# Patient Record
Sex: Male | Born: 1960 | Race: White | Hispanic: No | Marital: Married | Smoking: Current every day smoker
Health system: Southern US, Community
[De-identification: ages and names within clinical notes are randomized; demographics above are authoritative.]

## PROBLEM LIST (undated history)

## (undated) DIAGNOSIS — R0600 Dyspnea, unspecified: Secondary | ICD-10-CM

## (undated) DIAGNOSIS — M1990 Unspecified osteoarthritis, unspecified site: Secondary | ICD-10-CM

## (undated) HISTORY — PX: APPENDECTOMY: SHX54

---

## 2006-07-20 ENCOUNTER — Inpatient Hospital Stay: Payer: Self-pay | Admitting: Surgery

## 2006-07-20 ENCOUNTER — Other Ambulatory Visit: Payer: Self-pay

## 2010-10-09 ENCOUNTER — Emergency Department: Payer: Self-pay | Admitting: Unknown Physician Specialty

## 2011-11-29 IMAGING — CT CT MAXILLOFACIAL WITH CONTRAST
1 series · 15 of 30 positions shown, 19 images · non-contrast
Comparison: No comparison

REASON FOR EXAM: pain and swelling/ redness right eye and  upper cheek
area
COMMENTS:

PROCEDURE:     CT  - CT MAXILLOFACIAL AREA W  - October 09, 2010  [DATE]
RESULT:     History: Redness and swelling
TECHNIQUE: Multiple axial images obtained of the maxillofacial bones with
coronal reformatted images provided a following 75 mL of Tsovue-SFA
intravenous contrast.

[Series 3: soft tissue · axial · 0.37mm/px · z∈[+732,+936]mm · 15 of 74 slices shown, 19 images]
[im 3/74  brain]
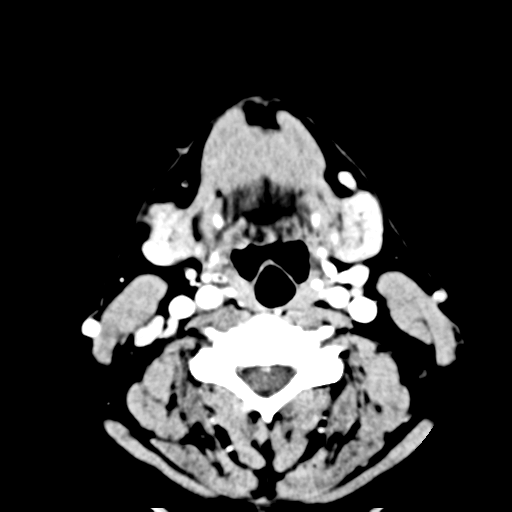
[im 3/74  bone]
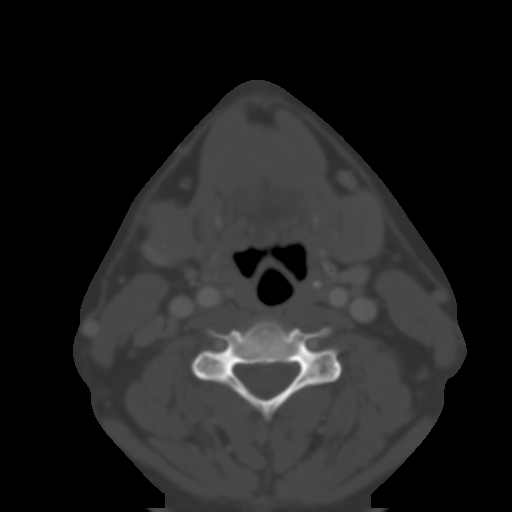
[im 8/74  bone]
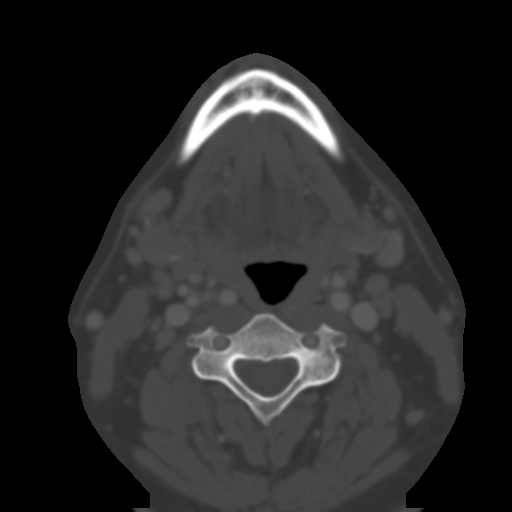
[im 13/74  bone]
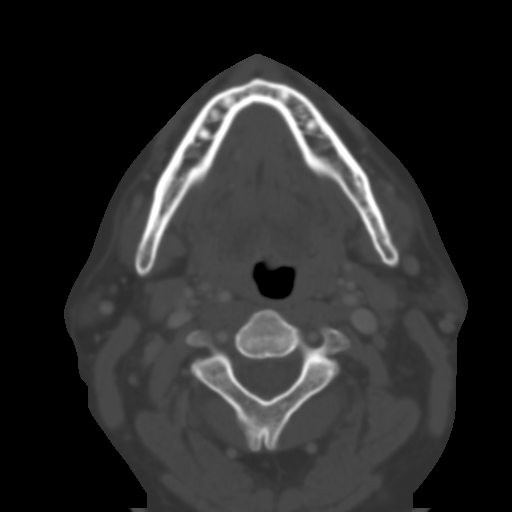
[im 18/74  bone]
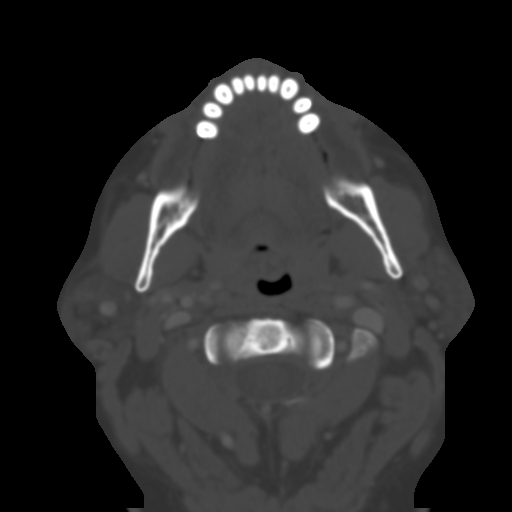
[im 23/74  brain]
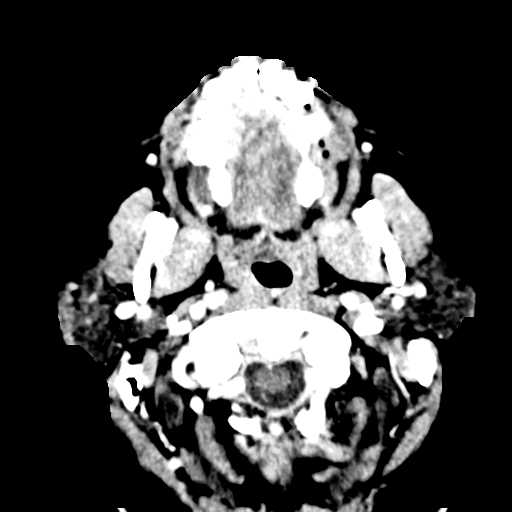
[im 23/74  bone]
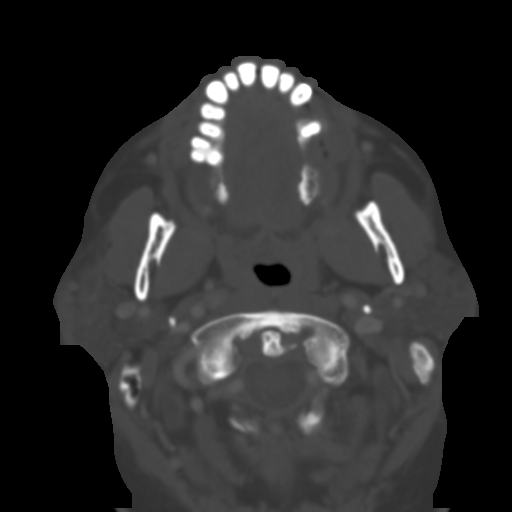
[im 28/74  bone]
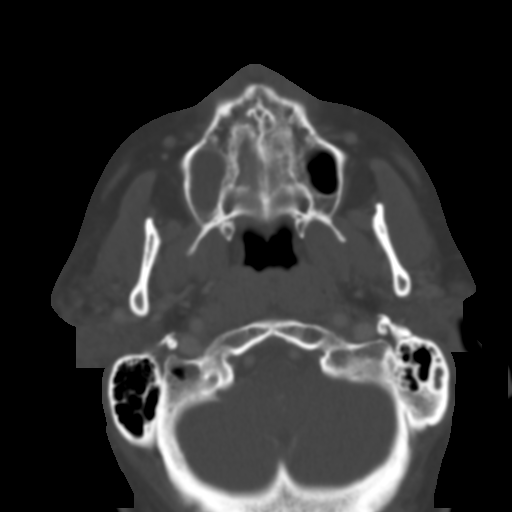
[im 33/74  bone]
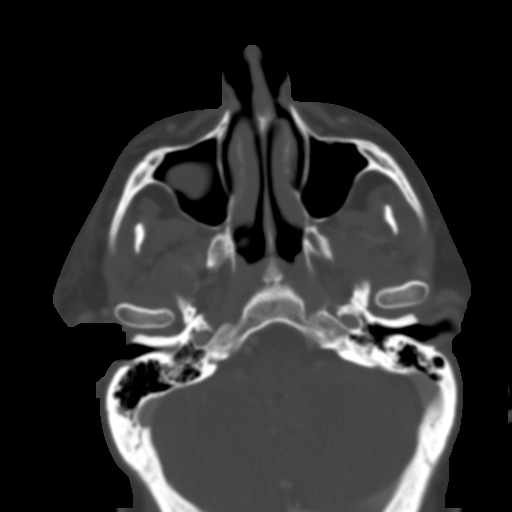
[im 38/74  bone]
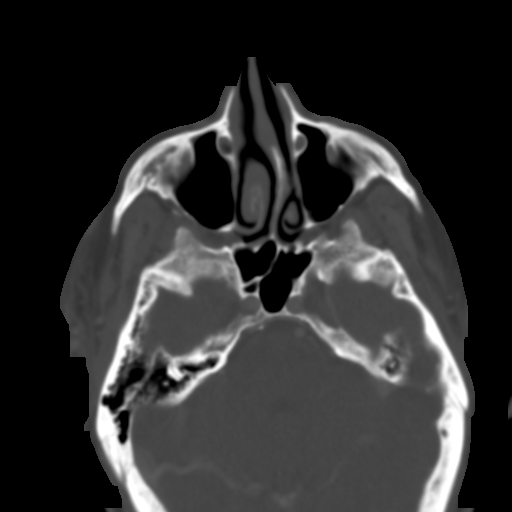
[im 41/74  brain]
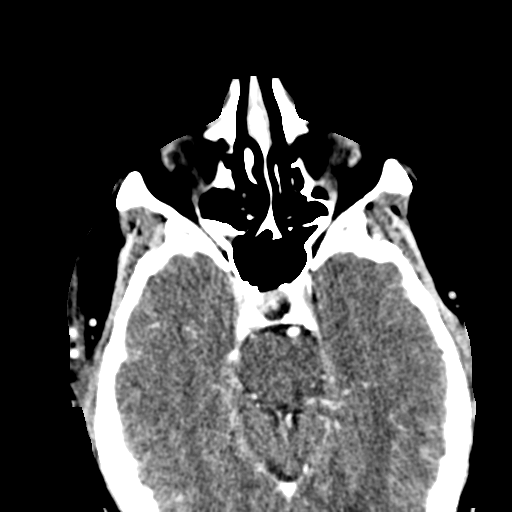
[im 41/74  bone]
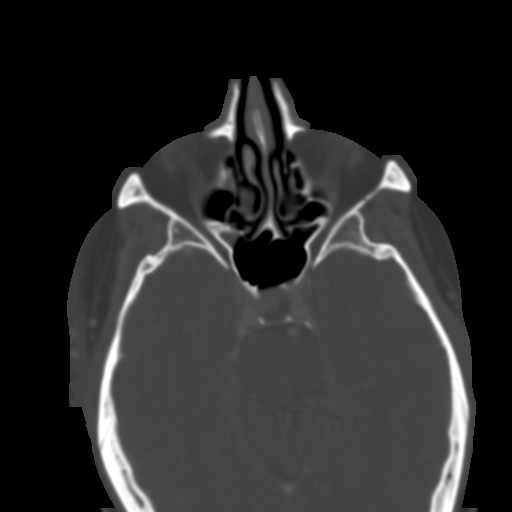
[im 46/74  bone]
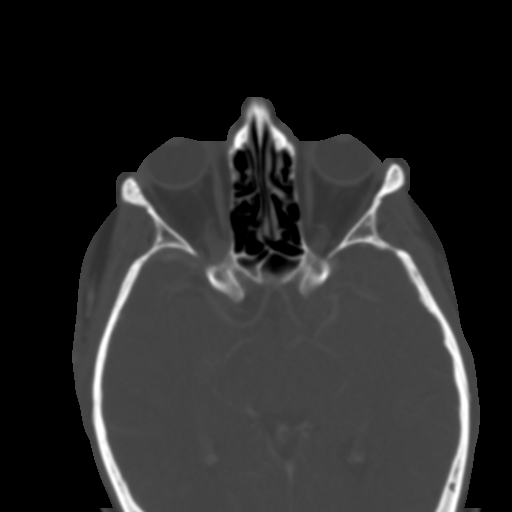
[im 51/74  bone]
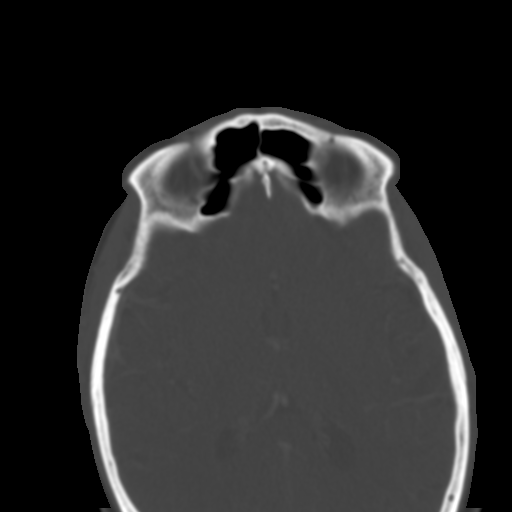
[im 56/74  bone]
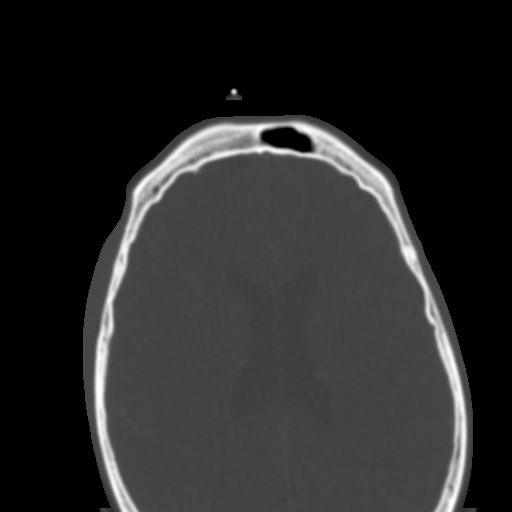
[im 61/74  brain]
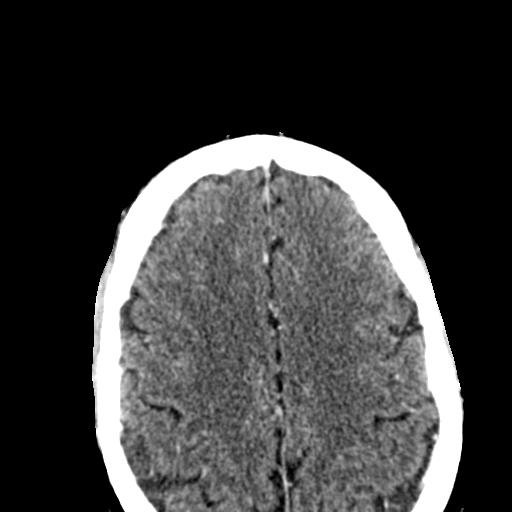
[im 61/74  bone]
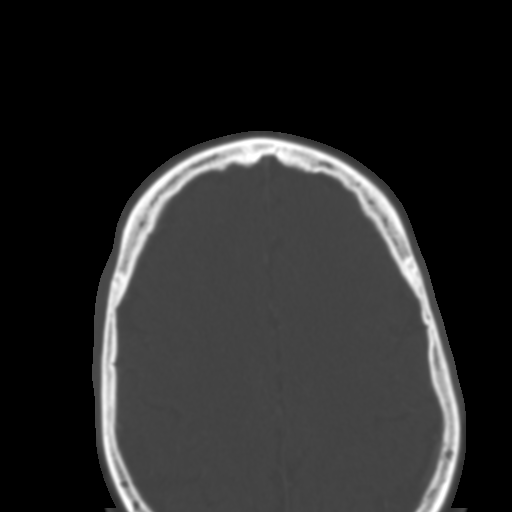
[im 66/74  bone]
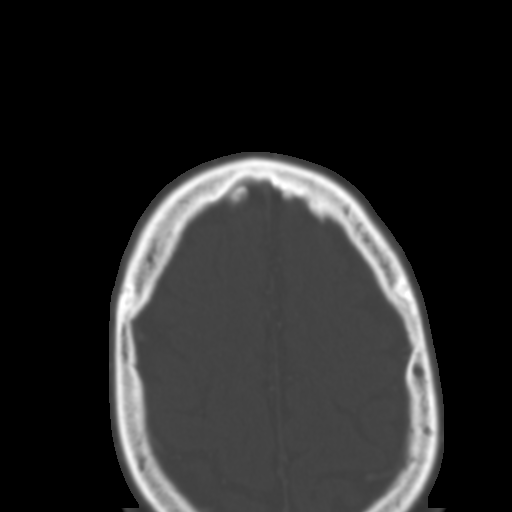
[im 71/74  bone]
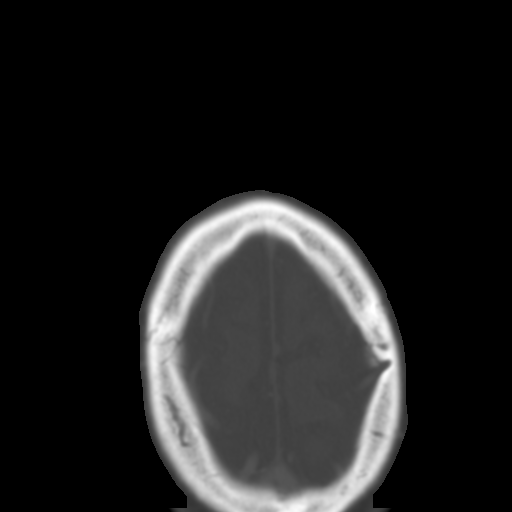

[15 of 30 positions shown; findings below may reference images not displayed]

FINDINGS: The globes are intact. The orbital walls are intact. The orbital floor is
intact. The maxilla and mandible are intact. The zygomatic arches are
intact. The nasal septum is midline. There is no nasal bone fracture. The
temporomandibular joints are normal.

There is a right maxillary sinus mucus retention cyst. The visualized
portions of the mastoid sinuses are well aerated. There is fat stranding in
the right cheek region just superior to the right parotid gland with
multiple small lymph nodes which may be secondary to an inflammatory,
infectious or posttraumatic process.
IMPRESSION: There is fat stranding in the right cheek region just superior to the right
parotid gland with multiple small lymph nodes which may be secondary to an
inflammatory, infectious or posttraumatic process.
 There is no focal fluid collection to suggest an abscess.

## 2016-12-15 ENCOUNTER — Encounter: Payer: Self-pay | Admitting: Gastroenterology

## 2016-12-15 ENCOUNTER — Other Ambulatory Visit: Payer: Self-pay

## 2016-12-15 ENCOUNTER — Ambulatory Visit (INDEPENDENT_AMBULATORY_CARE_PROVIDER_SITE_OTHER): Payer: BLUE CROSS/BLUE SHIELD | Admitting: Gastroenterology

## 2016-12-15 VITALS — BP 178/98 | HR 64 | Temp 98.1°F | Ht 72.0 in | Wt 298.0 lb

## 2016-12-15 DIAGNOSIS — R194 Change in bowel habit: Secondary | ICD-10-CM | POA: Diagnosis not present

## 2016-12-15 DIAGNOSIS — K921 Melena: Secondary | ICD-10-CM | POA: Diagnosis not present

## 2016-12-15 NOTE — Progress Notes (Addendum)
Gastroenterology Consultation  Referring Provider:     No ref. provider found Primary Care Physician:  No primary care provider on file. Primary Gastroenterologist:  Dr. Allen Norris     Reason for Consultation:     Hematochezia and change in bowel habits        HPI:   Joseph Knox is a 56 y.o. y/o male referred for consultation & management of Hematochezia and change in bowel habits by Dr. Rayne Du primary care provider on file..  This patient comes in today with a history of rectal bleeding. The patient states that his sister is a Librarian, academic and he called her because of his rectal bleeding. She had told him that she would advise him to get her gastroenterologist. The patient does not have a primary care provider and comes to see me today for his rectal bleeding. The patient also reports that his stools have changed to be pellet-like. There is no report of any unexplained weight loss, fevers, chills, nausea or vomiting. He also denies any abdominal pain associated with his rectal bleeding and change in bowel habits. The patient does have a family history with his mother having polyps in the past. The patient has never had a colonoscopy in the past.  History reviewed. No pertinent past medical history.  Past Surgical History:  Procedure Laterality Date  . APPENDECTOMY      Prior to Admission medications   Medication Sig Start Date End Date Taking? Authorizing Provider  ibuprofen (ADVIL,MOTRIN) 200 MG tablet Take 200 mg by mouth every 6 (six) hours as needed.   Yes Historical Provider, MD    Family History  Problem Relation Age of Onset  . Arthritis Mother   . Hyperlipidemia Mother   . Cancer Father   . Hyperlipidemia Father      Social History  Substance Use Topics  . Smoking status: Current Every Day Smoker  . Smokeless tobacco: Never Used  . Alcohol use Yes     Comment: occasional     Allergies as of 12/15/2016 - Review Complete 12/15/2016  Allergen Reaction Noted  .  Penicillins Rash 12/15/2016    Review of Systems:    All systems reviewed and negative except where noted in HPI.   Physical Exam:  BP (!) 178/98   Pulse 64   Temp 98.1 F (36.7 C) (Oral)   Ht 6' (1.829 m)   Wt 298 lb (135.2 kg)   BMI 40.42 kg/m  No LMP for male patient. Psych:  Alert and cooperative. Normal mood and affect. General:   Alert,  Well-developed, well-nourished, pleasant and cooperative in NAD Head:  Normocephalic and atraumatic. Eyes:  Sclera clear, no icterus.   Conjunctiva pink. Ears:  Normal auditory acuity. Nose:  No deformity, discharge, or lesions. Mouth:  No deformity or lesions,oropharynx pink & moist. Neck:  Supple; no masses or thyromegaly. Lungs:  Respirations even and unlabored.  Clear throughout to auscultation.   No wheezes, crackles, or rhonchi. No acute distress. Heart:  Regular rate and rhythm; no murmurs, clicks, rubs, or gallops. Abdomen:  Normal bowel sounds.  No bruits.  Soft, non-tender and non-distended without masses, hepatosplenomegaly or hernias noted.  No guarding or rebound tenderness.  Negative Carnett sign.   Rectal:  Deferred.  Msk:  Symmetrical without gross deformities.  Good, equal movement & strength bilaterally. Pulses:  Normal pulses noted. Extremities:  No clubbing or edema.  No cyanosis. Neurologic:  Alert and oriented x3;  grossly normal neurologically. Skin:  Intact  without significant lesions or rashes.  No jaundice. Lymph Nodes:  No significant cervical adenopathy. Psych:  Alert and cooperative. Normal mood and affect.  Imaging Studies: No results found.  Assessment and Plan:   [name] is a 56 y.o. y/o male who comes in with a change in bowel habits and rectal bleeding. The patient's stools have been smaller and pellet-like. The patient also states that he has been having rectal bleeding which he thinks may be hemorrhoidal. The patient will be set up for a colonoscopy.I have discussed risks & benefits which  include, but are not limited to, bleeding, infection, perforation & drug reaction.  The patient agrees with this plan & written consent will be obtained.       [name], MD. Marval Regal   Note: This dictation was prepared with Dragon dictation along with smaller phrase technology. Any transcriptional errors that result from this process are unintentional.

## 2016-12-16 ENCOUNTER — Other Ambulatory Visit: Payer: Self-pay | Admitting: *Deleted

## 2016-12-17 ENCOUNTER — Encounter: Payer: Self-pay | Admitting: *Deleted

## 2016-12-23 NOTE — Discharge Instructions (Signed)

## 2016-12-26 ENCOUNTER — Ambulatory Visit: Payer: BLUE CROSS/BLUE SHIELD | Admitting: Anesthesiology

## 2016-12-26 ENCOUNTER — Ambulatory Visit
Admission: RE | Admit: 2016-12-26 | Discharge: 2016-12-26 | Disposition: A | Discharge status: Home / Self Care | Payer: BLUE CROSS/BLUE SHIELD | Source: Ambulatory Visit | Attending: Gastroenterology | Admitting: Gastroenterology

## 2016-12-26 ENCOUNTER — Encounter
Admission: RE | Disposition: A | Discharge status: Home / Self Care | Payer: Self-pay | Source: Ambulatory Visit | Attending: Gastroenterology

## 2016-12-26 DIAGNOSIS — D123 Benign neoplasm of transverse colon: Secondary | ICD-10-CM

## 2016-12-26 DIAGNOSIS — K921 Melena: Secondary | ICD-10-CM | POA: Diagnosis not present

## 2016-12-26 DIAGNOSIS — K5730 Diverticulosis of large intestine without perforation or abscess without bleeding: Secondary | ICD-10-CM | POA: Diagnosis not present

## 2016-12-26 DIAGNOSIS — F17210 Nicotine dependence, cigarettes, uncomplicated: Secondary | ICD-10-CM | POA: Insufficient documentation

## 2016-12-26 DIAGNOSIS — D125 Benign neoplasm of sigmoid colon: Secondary | ICD-10-CM | POA: Diagnosis not present

## 2016-12-26 DIAGNOSIS — K641 Second degree hemorrhoids: Secondary | ICD-10-CM | POA: Insufficient documentation

## 2016-12-26 DIAGNOSIS — K635 Polyp of colon: Secondary | ICD-10-CM

## 2016-12-26 HISTORY — PX: COLONOSCOPY WITH PROPOFOL: SHX5780

## 2016-12-26 HISTORY — PX: POLYPECTOMY: SHX5525

## 2016-12-26 HISTORY — DX: Unspecified osteoarthritis, unspecified site: M19.90

## 2016-12-26 HISTORY — DX: Dyspnea, unspecified: R06.00

## 2016-12-26 SURGERY — COLONOSCOPY WITH PROPOFOL
Anesthesia: Monitor Anesthesia Care | Wound class: Contaminated

## 2016-12-26 MED ORDER — PROPOFOL 10 MG/ML IV BOLUS
INTRAVENOUS | Status: DC | PRN
  Administered 2016-12-26 (×6): 50 mg via INTRAVENOUS
  Administered 2016-12-26: 100 mg via INTRAVENOUS

## 2016-12-26 MED ORDER — LIDOCAINE HCL (CARDIAC) 20 MG/ML IV SOLN
INTRAVENOUS | Status: DC | PRN
  Administered 2016-12-26: 50 mg via INTRAVENOUS

## 2016-12-26 MED ORDER — LACTATED RINGERS IV SOLN
INTRAVENOUS | Status: DC
  Administered 2016-12-26: 10:00:00 via INTRAVENOUS

## 2016-12-26 MED ORDER — STERILE WATER FOR IRRIGATION IR SOLN
Status: DC | PRN
  Administered 2016-12-26: 11:00:00

## 2016-12-26 SURGICAL SUPPLY — 23 items
CANISTER SUCT 1200ML W/VALVE (MISCELLANEOUS) ×4 IMPLANT
CLIP HMST 235XBRD CATH ROT (MISCELLANEOUS) ×2 IMPLANT
CLIP RESOLUTION 360 11X235 (MISCELLANEOUS) ×2
FCP ESCP3.2XJMB 240X2.8X (MISCELLANEOUS)
FORCEPS BIOP RAD 4 LRG CAP 4 (CUTTING FORCEPS) IMPLANT
FORCEPS BIOP RJ4 240 W/NDL (MISCELLANEOUS)
FORCEPS ESCP3.2XJMB 240X2.8X (MISCELLANEOUS) IMPLANT
GOWN CVR UNV OPN BCK APRN NK (MISCELLANEOUS) ×4 IMPLANT
GOWN ISOL THUMB LOOP REG UNIV (MISCELLANEOUS) ×4
INJECTOR VARIJECT VIN23 (MISCELLANEOUS) IMPLANT
KIT DEFENDO VALVE AND CONN (KITS) IMPLANT
KIT ENDO PROCEDURE OLY (KITS) ×4 IMPLANT
MARKER SPOT ENDO TATTOO 5ML (MISCELLANEOUS) IMPLANT
PAD GROUND ADULT SPLIT (MISCELLANEOUS) IMPLANT
PROBE APC STR FIRE (PROBE) IMPLANT
RETRIEVER NET ROTH 2.5X230 LF (MISCELLANEOUS) IMPLANT
SNARE SHORT THROW 13M SML OVAL (MISCELLANEOUS) ×4 IMPLANT
SNARE SHORT THROW 30M LRG OVAL (MISCELLANEOUS) IMPLANT
SNARE SNG USE RND 15MM (INSTRUMENTS) IMPLANT
SPOT EX ENDOSCOPIC TATTOO (MISCELLANEOUS)
TRAP ETRAP POLY (MISCELLANEOUS) ×4 IMPLANT
VARIJECT INJECTOR VIN23 (MISCELLANEOUS)
WATER STERILE IRR 250ML POUR (IV SOLUTION) ×4 IMPLANT

## 2016-12-26 NOTE — Anesthesia Postprocedure Evaluation (Signed)
Anesthesia Post Note  Patient: Joseph Knox  Procedure(s) Performed: Procedure(s) (LRB): COLONOSCOPY WITH PROPOFOL (N/A) POLYPECTOMY  Patient location during evaluation: PACU Anesthesia Type: MAC Level of consciousness: awake and alert Pain management: pain level controlled Vital Signs Assessment: post-procedure vital signs reviewed and stable Respiratory status: spontaneous breathing, nonlabored ventilation, respiratory function stable and patient connected to nasal cannula oxygen Cardiovascular status: stable and blood pressure returned to baseline Anesthetic complications: no    Alisa Graff

## 2016-12-26 NOTE — Op Note (Signed)
[facility] Gastroenterology Patient Name: Joseph Knox Procedure Date: 12/26/2016 10:48 AM MRN: [id number] Account #: [id number] Date of Birth: 04-Oct-1961 Admit Type: Outpatient Age: 56 Room: [facility] OR ROOM 01 Gender: Male Note Status: Finalized Procedure:            Colonoscopy Indications:          Hematochezia Providers:            [name] MD, MD Medicines:            Propofol per Anesthesia Complications:        No immediate complications. Procedure:            Pre-Anesthesia Assessment:                       - Prior to the procedure, a History and Physical was                        performed, and patient medications and allergies were                        reviewed. The patient's tolerance of previous                        anesthesia was also reviewed. The risks and benefits of                        the procedure and the sedation options and risks were                        discussed with the patient. All questions were                        answered, and informed consent was obtained. Prior                        Anticoagulants: The patient has taken no previous                        anticoagulant or antiplatelet agents. ASA Grade                        Assessment: II - A patient with mild systemic disease.                        After reviewing the risks and benefits, the patient was                        deemed in satisfactory condition to undergo the                        procedure.                       After obtaining informed consent, the colonoscope was                        passed under direct vision. Throughout the procedure,                        the patient's blood pressure, pulse, and oxygen  saturations were monitored continuously. The was                        introduced through the anus and advanced to the the                        cecum, identified by appendiceal orifice and ileocecal         valve. The colonoscopy was performed without                        difficulty. The patient tolerated the procedure well.                        The quality of the bowel preparation was excellent. Findings:      The perianal and digital rectal examinations were normal.      A 15 mm polyp was found in the hepatic flexure. The polyp was sessile.       Area was successfully injected with saline for a lift polypectomy. The       polyp was removed with a hot snare. Resection and retrieval were       complete. To prevent bleeding post-intervention, one hemostatic clip was       successfully placed (MR conditional). There was no bleeding at the end       of the procedure.      A 8 mm polyp was found in the sigmoid colon. The polyp was pedunculated.       The polyp was removed with a hot snare. Resection and retrieval were       complete.      Multiple small-mouthed diverticula were found in the sigmoid colon.      Non-bleeding internal hemorrhoids were found during retroflexion. The       hemorrhoids were Grade II (internal hemorrhoids that prolapse but reduce       spontaneously). Impression:           - One 15 mm polyp at the hepatic flexure, removed with                        a hot snare. Resected and retrieved. Injected. Clip (MR                        conditional) was placed.                       - One 8 mm polyp in the sigmoid colon, removed with a                        hot snare. Resected and retrieved.                       - Diverticulosis in the sigmoid colon.                       - Non-bleeding internal hemorrhoids. Recommendation:       - Discharge patient to home.                       - Resume previous diet.                       -  Continue present medications.                       - Await pathology results.                       - Repeat colonoscopy in 5 years if polyp adenoma and 10                        years if hyperplastic Procedure Code(s):    --- Professional  ---                       [phone], Colonoscopy, flexible; with removal of tumor(s),                        polyp(s), or other lesion(s) by snare technique                       45381, Colonoscopy, flexible; with directed submucosal                        injection(s), any substance Diagnosis Code(s):    --- Professional ---                       K92.1, Melena (includes Hematochezia)                       D12.3, Benign neoplasm of transverse colon (hepatic                        flexure or splenic flexure)                       D12.5, Benign neoplasm of sigmoid colon CPT copyright 2016 American Medical Association. All rights reserved. The codes documented in this report are preliminary and upon coder review may  be revised to meet current compliance requirements. [name] MD, MD 12/26/2016 11:09:35 AM This report has been signed electronically. Number of Addenda: 0 Note Initiated On: 12/26/2016 10:48 AM Scope Withdrawal Time: 0 hours 11 minutes 7 seconds  Total Procedure Duration: 0 hours 13 minutes 1 second       [facility]

## 2016-12-26 NOTE — H&P (Signed)
  [name], MD [facility] [address] [place], [place] [address] Phone: [phone] Fax : [phone]  Primary Care Physician:  No primary care provider on file. Primary Gastroenterologist:  Dr. Allen Norris  Pre-Procedure History & Physical: HPI:  Joseph Knox is a 56 y.o. male is here for an colonoscopy.   Past Medical History:  Diagnosis Date  . Arthritis    knees, fingers  . Dyspnea    "from smoking"    Past Surgical History:  Procedure Laterality Date  . APPENDECTOMY      Prior to Admission medications   Medication Sig Start Date End Date Taking? Authorizing Provider  ibuprofen (ADVIL,MOTRIN) 200 MG tablet Take 200 mg by mouth every 6 (six) hours as needed.   Yes Historical Provider, MD    Allergies as of 12/16/2016 - Review Complete 12/15/2016  Allergen Reaction Noted  . Penicillins Rash 12/15/2016    Family History  Problem Relation Age of Onset  . Arthritis Mother   . Hyperlipidemia Mother   . Cancer Father   . Hyperlipidemia Father     Social History   Social History  . Marital status: Married    Spouse name: N/A  . Number of children: N/A  . Years of education: N/A   Occupational History  . Not on file.   Social History Main Topics  . Smoking status: Current Every Day Smoker    Packs/day: 2.00    Years: 43.00  . Smokeless tobacco: Never Used     Comment: started smoking age 45  . Alcohol use Yes     Comment: occasional   . Drug use: Yes    Types: Marijuana  . Sexual activity: Not on file   Other Topics Concern  . Not on file   Social History Narrative  . No narrative on file    Review of Systems: See HPI, otherwise negative ROS  Physical Exam: Ht 6' (1.829 m)   Wt 298 lb (135.2 kg)   BMI 40.42 kg/m  General:   Alert,  pleasant and cooperative in NAD Head:  Normocephalic and atraumatic. Neck:  Supple; no masses or thyromegaly. Lungs:  Clear throughout to auscultation.    Heart:  Regular rate and rhythm. Abdomen:  Soft,  nontender and nondistended. Normal bowel sounds, without guarding, and without rebound.   Neurologic:  Alert and  oriented x4;  grossly normal neurologically.  Impression/Plan: Lamine Caravantes is here for an colonoscopy to be performed for hematochezia  Risks, benefits, limitations, and alternatives regarding  colonoscopy have been reviewed with the patient.  Questions have been answered.  All parties agreeable.   [name], MD  12/26/2016, 10:19 AM

## 2016-12-26 NOTE — Anesthesia Preprocedure Evaluation (Signed)
Anesthesia Evaluation  Patient identified by MRN, date of birth, ID band Patient awake    Reviewed: Allergy & Precautions, H&P , NPO status , Patient's Chart, lab work & pertinent test results, reviewed documented beta blocker date and time   Airway Mallampati: II  TM Distance: >3 FB Neck ROM: full    Dental no notable dental hx.    Pulmonary shortness of breath, Current Smoker (86 pack years),    Pulmonary exam normal breath sounds clear to auscultation       Cardiovascular Exercise Tolerance: Good negative cardio ROS   Rhythm:regular Rate:Normal     Neuro/Psych negative neurological ROS  negative psych ROS   GI/Hepatic negative GI ROS, Neg liver ROS,   Endo/Other  negative endocrine ROS  Renal/GU negative Renal ROS  negative genitourinary   Musculoskeletal   Abdominal   Peds  Hematology negative hematology ROS (+)   Anesthesia Other Findings   Reproductive/Obstetrics negative OB ROS                             Anesthesia Physical Anesthesia Plan  ASA: II  Anesthesia Plan: MAC   Post-op Pain Management:    Induction:   Airway Management Planned:   Additional Equipment:   Intra-op Plan:   Post-operative Plan:   Informed Consent: I have reviewed the patients History and Physical, chart, labs and discussed the procedure including the risks, benefits and alternatives for the proposed anesthesia with the patient or authorized representative who has indicated his/her understanding and acceptance.   Dental Advisory Given  Plan Discussed with: CRNA  Anesthesia Plan Comments:         Anesthesia Quick Evaluation

## 2016-12-26 NOTE — Anesthesia Procedure Notes (Signed)
Procedure Name: MAC Performed by: Tyrick Dunagan Pre-anesthesia Checklist: Patient identified, Emergency Drugs available, Suction available, Timeout performed and Patient being monitored Patient Re-evaluated:Patient Re-evaluated prior to inductionOxygen Delivery Method: Nasal cannula Placement Confirmation: positive ETCO2     

## 2016-12-26 NOTE — Transfer of Care (Signed)
Immediate Anesthesia Transfer of Care Note  Patient: Joseph Knox  Procedure(s) Performed: Procedure(s) with comments: COLONOSCOPY WITH PROPOFOL (N/A) - prefer to be late POLYPECTOMY  Patient Location: PACU  Anesthesia Type: MAC  Level of Consciousness: awake, alert  and patient cooperative  Airway and Oxygen Therapy: Patient Spontanous Breathing and Patient connected to supplemental oxygen  Post-op Assessment: Post-op Vital signs reviewed, Patient's Cardiovascular Status Stable, Respiratory Function Stable, Patent Airway and No signs of Nausea or vomiting  Post-op Vital Signs: Reviewed and stable  Complications: No apparent anesthesia complications

## 2016-12-29 ENCOUNTER — Encounter: Payer: Self-pay | Admitting: Gastroenterology

## 2016-12-30 ENCOUNTER — Encounter: Payer: Self-pay | Admitting: Gastroenterology

## 2016-12-31 ENCOUNTER — Encounter: Payer: Self-pay | Admitting: Gastroenterology

## 2018-08-13 ENCOUNTER — Ambulatory Visit
Admission: EM | Admit: 2018-08-13 | Discharge: 2018-08-13 | Disposition: A | Discharge status: Home / Self Care | Payer: BLUE CROSS/BLUE SHIELD | Attending: Emergency Medicine | Admitting: Emergency Medicine

## 2018-08-13 ENCOUNTER — Other Ambulatory Visit: Payer: Self-pay

## 2018-08-13 DIAGNOSIS — H209 Unspecified iridocyclitis: Secondary | ICD-10-CM

## 2018-08-13 MED ORDER — FLUORESCEIN SODIUM 1 MG OP STRP: 1.0000 | ORAL_STRIP | Freq: Once | OPHTHALMIC | Status: DC

## 2018-08-13 MED ORDER — TETRACAINE HCL 0.5 % OP SOLN
1.0000 [drp] | Freq: Once | OPHTHALMIC | Status: AC
  Administered 2018-08-13: 1 [drp] via OPHTHALMIC

## 2018-08-13 NOTE — ED Provider Notes (Signed)
HPI  SUBJECTIVE:  Joseph Knox is a 57 y.o. male who presents with 2 days of a foreign body sensation, redness, increased tearing in his right eye.  States that he has been working in a basement and has been around a lot of dust but does not recall any specific trauma.  He reports intermittent, seconds long photophobia, sharp eye pain.  He reports blurry vision when eyes watery, no double vision.  No purulent discharge, headache, halos around lights, eye pain worse in the dark.  No contacts with similar symptoms.  He has tried Clear Eyes, Visine, irrigation without improvement in symptoms.  Symptoms are worse with going out into the sun.  He wears glasses for distance.  He does not wear contacts.  Past medical history negative for glaucoma, diabetes.  He has a history of high blood pressure, but does have a formal diagnosis of this.  He does not take any medicines for this.  MD: None.  Ophthalmology: None.    Past Medical History:  Diagnosis Date  . Arthritis    knees, fingers  . Dyspnea    "from smoking"    Past Surgical History:  Procedure Laterality Date  . APPENDECTOMY    . COLONOSCOPY WITH PROPOFOL N/A 12/26/2016   Procedure: COLONOSCOPY WITH PROPOFOL;  Surgeon: [name], MD;  Location: [facility];  Service: Endoscopy;  Laterality: N/A;  prefer to be late  . POLYPECTOMY  12/26/2016   Procedure: POLYPECTOMY;  Surgeon: [name], MD;  Location: [facility];  Service: Endoscopy;;    Family History  Problem Relation Age of Onset  . Arthritis Mother   . Hyperlipidemia Mother   . Cancer Father   . Hyperlipidemia Father     Social History   Tobacco Use  . Smoking status: Current Every Day Smoker    Packs/day: 2.00    Years: 43.00    Pack years: 86.00  . Smokeless tobacco: Never Used  . Tobacco comment: started smoking age 73  Substance Use Topics  . Alcohol use: Not Currently  . Drug use: Yes    Types: Marijuana     Current Facility-Administered  Medications:  .  fluorescein ophthalmic strip 1 strip, 1 strip, Right Eye, Once, Melynda Ripple, MD .  tetracaine (PONTOCAINE) 0.5 % ophthalmic solution 1 drop, 1 drop, Right Eye, Once, Melynda Ripple, MD No current outpatient medications on file.  Allergies  Allergen Reactions  . Penicillins Rash     ROS  As noted in HPI.   Physical Exam  BP (!) 156/90 (BP Location: Left Arm)   Pulse (!) 57   Temp 97.8 F (36.6 C) (Oral)   Resp 18   Ht 6' (1.829 m)   Wt 136.1 kg   SpO2 97%   BMI 40.69 kg/m   Constitutional: Well developed, well nourished, no acute distress Eyes:  EOMI, PERRLA, right-sided conjunctival injection.  Positive direct and consensual photophobia.  No foreign body seen on lid eversion.  Positive increased tearing.  No purulent drainage.  No foreign body seen on magnification.  No corneal abrasion seen on flourescin exam.   No periorbital erythema, edema, facial rash.   Visual Acuity  Right Eye Distance: 20/25(corrected) Left Eye Distance: 20/20(corrected) Bilateral Distance: 20/15(corrected)  Right Eye Near:   Left Eye Near:    Bilateral Near:     HENT: Normocephalic, atraumatic,mucus membranes moist Respiratory: Normal inspiratory effort Cardiovascular: Normal rate GI: nondistended skin: No rash, skin intact Musculoskeletal: no deformities Neurologic: Alert & oriented  x 3, no focal neuro deficits Psychiatric: Speech and behavior appropriate   ED Course   Medications  fluorescein ophthalmic strip 1 strip (has no administration in time range)  tetracaine (PONTOCAINE) 0.5 % ophthalmic solution 1 drop (has no administration in time range)    No orders of the defined types were placed in this encounter.   No results found for this or any previous visit (from the past 24 hour(s)). No results found.  ED Clinical Impression  Iritis of right eye   ED Assessment/Plan  Presentation concerning for iritis.  Talked with Dr. Edison Pace, ophthalmology  on call.  He will see the patient at the [facility] office of [facility] at 130 today.  Patient has agreed to see Dr. Edison Pace at 130 today. also provided primary care list for routine care. Discussed MDM, treatment plan, and plan for follow-up with patient.. patient agrees with plan.   Meds ordered this encounter  Medications  . fluorescein ophthalmic strip 1 strip  . tetracaine (PONTOCAINE) 0.5 % ophthalmic solution 1 drop    *This clinic note was created using Lobby[employer]. Therefore, there may be occasional mistakes despite careful proofreading.   ?   Melynda Ripple, MD 08/14/18 1359

## 2018-08-13 NOTE — Discharge Instructions (Addendum)
Dr. Edison Pace, ophthalmologist, will see you at 130 today at the [facility] office of [facility].  Also, you need to have someone keep an eye on your blood pressure.  Follow-up with a primary care physician of your choice, see list below..  Here is a list of primary care providers who are taking new patients:  Dr. Otilio Miu, Dr. Adline Potter [address] [place] [address] [place] [address]  [phone]  [facility] [address] [place], [place] [address] [phone]  [facility]  [phone] [place], [place] [address]  Here are clinics/ other resources who will see you if you do not have insurance. Some have certain criteria that you must meet. Call them and find out what they are:  [facility]: [address] [place], [place] [address] Phone: [phone] Hours: First and Third Saturdays of each Month, 9 a.m. - 1 p.m.  [facility]: [address], [place], [place], [place] [address] Phone: [phone] Hours: Tuesday, 4 p.m. - 8 p.m. Thursday, 1 p.m. - 8 p.m. Wednesday, 9 a.m. - [facility] [address] [place], [place] [address] Phone: [phone] Pharmacy Phone Number: [phone] Dental Phone Number: [phone] [place] Help: [phone]  Dental Hours: Monday - Thursday, 8 a.m. - 6 p.m.  [facility] [address] [place], [place] [address] Phone: [phone] Pharmacy Phone Number: [phone] [facility] Insurance Help: [phone]  [facility] [address] [place], [place] [address] Phone: [phone] Pharmacy Phone Number: [phone] [facility] Insurance Help: [phone]  [facility] [address] [place], [place] [address] Phone: [phone] [facility] Insurance Help: [phone]   [address] [place], [place] [address] Phone: [phone]  Go to www.goodrx.com to look up your medications. This will give you a list of where you can find your prescriptions at the most affordable prices. Or ask the pharmacist what the cash price is, or if they have any other discount programs available to help make your medication more affordable. This can be less expensive than what you would pay with insurance.

## 2018-08-13 NOTE — ED Triage Notes (Signed)
Patient complains of right eye pain, states that a few days ago this started and thinks something may have got in to his eye.

## 2022-10-10 DIAGNOSIS — I951 Orthostatic hypotension: Secondary | ICD-10-CM | POA: Diagnosis not present

## 2022-10-10 DIAGNOSIS — Z6838 Body mass index (BMI) 38.0-38.9, adult: Secondary | ICD-10-CM | POA: Diagnosis not present

## 2022-10-10 DIAGNOSIS — E669 Obesity, unspecified: Secondary | ICD-10-CM | POA: Diagnosis not present

## 2022-10-10 DIAGNOSIS — Z8249 Family history of ischemic heart disease and other diseases of the circulatory system: Secondary | ICD-10-CM | POA: Diagnosis not present

## 2022-10-10 DIAGNOSIS — Z809 Family history of malignant neoplasm, unspecified: Secondary | ICD-10-CM | POA: Diagnosis not present

## 2022-10-10 DIAGNOSIS — Z833 Family history of diabetes mellitus: Secondary | ICD-10-CM | POA: Diagnosis not present

## 2022-10-10 DIAGNOSIS — R030 Elevated blood-pressure reading, without diagnosis of hypertension: Secondary | ICD-10-CM | POA: Diagnosis not present

## 2022-10-10 DIAGNOSIS — Z720 Tobacco use: Secondary | ICD-10-CM | POA: Diagnosis not present

## 2023-06-04 DIAGNOSIS — E785 Hyperlipidemia, unspecified: Secondary | ICD-10-CM | POA: Diagnosis not present

## 2023-06-04 DIAGNOSIS — R079 Chest pain, unspecified: Secondary | ICD-10-CM | POA: Diagnosis not present

## 2023-06-04 DIAGNOSIS — R001 Bradycardia, unspecified: Secondary | ICD-10-CM | POA: Diagnosis not present

## 2023-06-04 DIAGNOSIS — R202 Paresthesia of skin: Secondary | ICD-10-CM | POA: Diagnosis not present

## 2023-06-04 DIAGNOSIS — M5412 Radiculopathy, cervical region: Secondary | ICD-10-CM | POA: Diagnosis not present

## 2023-06-04 DIAGNOSIS — Z79899 Other long term (current) drug therapy: Secondary | ICD-10-CM | POA: Diagnosis not present

## 2023-06-04 DIAGNOSIS — I10 Essential (primary) hypertension: Secondary | ICD-10-CM | POA: Diagnosis not present

## 2023-06-04 DIAGNOSIS — R9431 Abnormal electrocardiogram [ECG] [EKG]: Secondary | ICD-10-CM | POA: Diagnosis not present

## 2023-06-05 DIAGNOSIS — E669 Obesity, unspecified: Secondary | ICD-10-CM | POA: Diagnosis not present

## 2023-06-05 DIAGNOSIS — Z6839 Body mass index (BMI) 39.0-39.9, adult: Secondary | ICD-10-CM | POA: Diagnosis not present

## 2023-06-05 DIAGNOSIS — Z880 Allergy status to penicillin: Secondary | ICD-10-CM | POA: Diagnosis not present

## 2023-06-05 DIAGNOSIS — Z973 Presence of spectacles and contact lenses: Secondary | ICD-10-CM | POA: Diagnosis not present

## 2023-06-05 DIAGNOSIS — M5412 Radiculopathy, cervical region: Secondary | ICD-10-CM | POA: Diagnosis not present

## 2023-06-05 DIAGNOSIS — F1021 Alcohol dependence, in remission: Secondary | ICD-10-CM | POA: Diagnosis not present

## 2023-06-05 DIAGNOSIS — Z720 Tobacco use: Secondary | ICD-10-CM | POA: Diagnosis not present

## 2023-06-05 DIAGNOSIS — N529 Male erectile dysfunction, unspecified: Secondary | ICD-10-CM | POA: Diagnosis not present

## 2023-06-26 DIAGNOSIS — M47812 Spondylosis without myelopathy or radiculopathy, cervical region: Secondary | ICD-10-CM | POA: Diagnosis not present

## 2023-06-26 DIAGNOSIS — M5412 Radiculopathy, cervical region: Secondary | ICD-10-CM | POA: Diagnosis not present

## 2023-06-26 DIAGNOSIS — Z1331 Encounter for screening for depression: Secondary | ICD-10-CM | POA: Diagnosis not present

## 2023-06-26 DIAGNOSIS — M79601 Pain in right arm: Secondary | ICD-10-CM | POA: Diagnosis not present

## 2023-06-26 DIAGNOSIS — E6609 Other obesity due to excess calories: Secondary | ICD-10-CM | POA: Diagnosis not present

## 2023-06-26 DIAGNOSIS — E7800 Pure hypercholesterolemia, unspecified: Secondary | ICD-10-CM | POA: Diagnosis not present

## 2023-06-26 DIAGNOSIS — Z6838 Body mass index (BMI) 38.0-38.9, adult: Secondary | ICD-10-CM | POA: Diagnosis not present

## 2023-06-26 DIAGNOSIS — M79602 Pain in left arm: Secondary | ICD-10-CM | POA: Diagnosis not present

## 2023-06-26 DIAGNOSIS — R202 Paresthesia of skin: Secondary | ICD-10-CM | POA: Diagnosis not present

## 2023-06-26 DIAGNOSIS — M4312 Spondylolisthesis, cervical region: Secondary | ICD-10-CM | POA: Diagnosis not present

## 2023-06-26 DIAGNOSIS — I10 Essential (primary) hypertension: Secondary | ICD-10-CM | POA: Diagnosis not present

## 2023-06-26 DIAGNOSIS — Z1330 Encounter for screening examination for mental health and behavioral disorders, unspecified: Secondary | ICD-10-CM | POA: Diagnosis not present

## 2023-09-08 DIAGNOSIS — Z720 Tobacco use: Secondary | ICD-10-CM | POA: Diagnosis not present

## 2023-09-08 DIAGNOSIS — E559 Vitamin D deficiency, unspecified: Secondary | ICD-10-CM | POA: Diagnosis not present

## 2023-09-08 DIAGNOSIS — N529 Male erectile dysfunction, unspecified: Secondary | ICD-10-CM | POA: Diagnosis not present

## 2023-09-08 DIAGNOSIS — Z23 Encounter for immunization: Secondary | ICD-10-CM | POA: Diagnosis not present

## 2023-09-08 DIAGNOSIS — E119 Type 2 diabetes mellitus without complications: Secondary | ICD-10-CM | POA: Diagnosis not present

## 2023-09-08 DIAGNOSIS — Z125 Encounter for screening for malignant neoplasm of prostate: Secondary | ICD-10-CM | POA: Diagnosis not present

## 2023-09-08 DIAGNOSIS — I10 Essential (primary) hypertension: Secondary | ICD-10-CM | POA: Diagnosis not present

## 2024-03-02 DIAGNOSIS — I10 Essential (primary) hypertension: Secondary | ICD-10-CM | POA: Diagnosis not present

## 2024-03-02 DIAGNOSIS — J069 Acute upper respiratory infection, unspecified: Secondary | ICD-10-CM | POA: Diagnosis not present

## 2024-03-02 DIAGNOSIS — J4521 Mild intermittent asthma with (acute) exacerbation: Secondary | ICD-10-CM | POA: Diagnosis not present
